# Patient Record
Sex: Female | Born: 1960 | Race: White | Hispanic: No | Marital: Married | Smoking: Never smoker
Health system: Southern US, Community
[De-identification: ages and names within clinical notes are randomized; demographics above are authoritative.]

## PROBLEM LIST (undated history)

## (undated) DIAGNOSIS — I10 Essential (primary) hypertension: Secondary | ICD-10-CM

## (undated) DIAGNOSIS — F419 Anxiety disorder, unspecified: Secondary | ICD-10-CM

## (undated) HISTORY — DX: Anxiety disorder, unspecified: F41.9

## (undated) HISTORY — DX: Essential (primary) hypertension: I10

## (undated) HISTORY — PX: TUBAL LIGATION: SHX77

---

## 2016-07-29 ENCOUNTER — Encounter (INDEPENDENT_AMBULATORY_CARE_PROVIDER_SITE_OTHER): Payer: Self-pay | Admitting: *Deleted

## 2019-04-06 ENCOUNTER — Other Ambulatory Visit: Payer: Self-pay

## 2019-04-06 DIAGNOSIS — Z20822 Contact with and (suspected) exposure to covid-19: Secondary | ICD-10-CM

## 2019-04-08 LAB — NOVEL CORONAVIRUS, NAA: SARS-CoV-2, NAA: NOT DETECTED

## 2019-04-10 ENCOUNTER — Telehealth: Payer: Self-pay | Admitting: General Practice

## 2019-04-10 NOTE — Telephone Encounter (Signed)
Negative COVID results given. Patient results "NOT Detected." Caller expressed understanding. ° °

## 2019-06-06 ENCOUNTER — Other Ambulatory Visit: Payer: Self-pay

## 2019-06-06 DIAGNOSIS — Z20822 Contact with and (suspected) exposure to covid-19: Secondary | ICD-10-CM

## 2019-06-08 LAB — NOVEL CORONAVIRUS, NAA

## 2019-06-09 ENCOUNTER — Telehealth: Payer: Self-pay

## 2019-06-09 NOTE — Telephone Encounter (Signed)
Pt. Calling for results. Specimen was insufficient. Instructed pt. To be re-tested. Verbalizes understanding.

## 2019-07-31 ENCOUNTER — Other Ambulatory Visit (HOSPITAL_COMMUNITY): Payer: Self-pay | Admitting: Internal Medicine

## 2019-07-31 DIAGNOSIS — Z1231 Encounter for screening mammogram for malignant neoplasm of breast: Secondary | ICD-10-CM

## 2019-08-07 ENCOUNTER — Inpatient Hospital Stay (HOSPITAL_COMMUNITY): Admission: RE | Admit: 2019-08-07 | Payer: Self-pay | Source: Ambulatory Visit

## 2019-08-17 ENCOUNTER — Ambulatory Visit (HOSPITAL_COMMUNITY): Payer: Self-pay

## 2019-08-24 ENCOUNTER — Ambulatory Visit (HOSPITAL_COMMUNITY): Payer: Self-pay

## 2019-10-02 ENCOUNTER — Ambulatory Visit (HOSPITAL_COMMUNITY): Payer: Self-pay

## 2019-10-12 ENCOUNTER — Inpatient Hospital Stay (HOSPITAL_COMMUNITY): Admission: RE | Admit: 2019-10-12 | Payer: Self-pay | Source: Ambulatory Visit

## 2019-10-19 ENCOUNTER — Ambulatory Visit (HOSPITAL_COMMUNITY): Payer: Self-pay

## 2019-10-26 ENCOUNTER — Ambulatory Visit (HOSPITAL_COMMUNITY)
Admission: RE | Admit: 2019-10-26 | Discharge: 2019-10-26 | Disposition: A | Discharge status: Home / Self Care | Payer: 59 | Source: Ambulatory Visit | Attending: Internal Medicine | Admitting: Internal Medicine

## 2019-10-26 ENCOUNTER — Encounter (HOSPITAL_COMMUNITY): Payer: Self-pay

## 2019-10-26 ENCOUNTER — Other Ambulatory Visit: Payer: Self-pay

## 2019-10-26 DIAGNOSIS — Z1231 Encounter for screening mammogram for malignant neoplasm of breast: Secondary | ICD-10-CM | POA: Insufficient documentation

## 2019-11-06 ENCOUNTER — Other Ambulatory Visit (HOSPITAL_COMMUNITY): Payer: Self-pay | Admitting: Internal Medicine

## 2019-11-06 ENCOUNTER — Inpatient Hospital Stay
Admission: RE | Admit: 2019-11-06 | Discharge: 2019-11-06 | Disposition: A | Discharge status: Home / Self Care | Payer: Self-pay | Source: Ambulatory Visit | Attending: Internal Medicine | Admitting: Internal Medicine

## 2019-11-06 DIAGNOSIS — Z1231 Encounter for screening mammogram for malignant neoplasm of breast: Secondary | ICD-10-CM

## 2019-11-08 ENCOUNTER — Telehealth: Payer: Self-pay | Admitting: Adult Health

## 2019-11-08 NOTE — Telephone Encounter (Signed)

## 2019-11-09 ENCOUNTER — Other Ambulatory Visit: Payer: 59 | Admitting: Adult Health

## 2019-12-18 ENCOUNTER — Other Ambulatory Visit: Payer: 59 | Admitting: Adult Health

## 2020-02-22 ENCOUNTER — Other Ambulatory Visit (HOSPITAL_COMMUNITY): Payer: Self-pay | Admitting: Internal Medicine

## 2020-02-22 ENCOUNTER — Ambulatory Visit (HOSPITAL_COMMUNITY)
Admission: RE | Admit: 2020-02-22 | Discharge: 2020-02-22 | Disposition: A | Discharge status: Home / Self Care | Payer: 59 | Source: Ambulatory Visit | Attending: Internal Medicine | Admitting: Internal Medicine

## 2020-02-22 ENCOUNTER — Other Ambulatory Visit: Payer: Self-pay

## 2020-02-22 DIAGNOSIS — M25562 Pain in left knee: Secondary | ICD-10-CM

## 2020-09-16 ENCOUNTER — Other Ambulatory Visit (HOSPITAL_COMMUNITY): Payer: Self-pay | Admitting: Internal Medicine

## 2020-09-16 DIAGNOSIS — Z1231 Encounter for screening mammogram for malignant neoplasm of breast: Secondary | ICD-10-CM

## 2020-10-28 ENCOUNTER — Ambulatory Visit (HOSPITAL_COMMUNITY)
Admission: RE | Admit: 2020-10-28 | Discharge: 2020-10-28 | Disposition: A | Discharge status: Home / Self Care | Payer: 59 | Source: Ambulatory Visit | Attending: Internal Medicine | Admitting: Internal Medicine

## 2020-10-28 ENCOUNTER — Other Ambulatory Visit: Payer: Self-pay

## 2020-10-28 DIAGNOSIS — Z1231 Encounter for screening mammogram for malignant neoplasm of breast: Secondary | ICD-10-CM | POA: Insufficient documentation

## 2021-09-22 IMAGING — MG MM DIGITAL SCREENING BILAT W/ TOMO AND CAD
8 series · 9 of 24 positions shown · non-contrast
Comparison: Previous exam(s).

CLINICAL DATA: Screening.

EXAM:
DIGITAL SCREENING BILATERAL MAMMOGRAM WITH TOMOSYNTHESIS AND CAD
TECHNIQUE: Bilateral screening digital craniocaudal and mediolateral oblique
mammograms were obtained. Bilateral screening digital breast
tomosynthesis was performed. The images were evaluated with
computer-aided detection.

[L MLO synth-2D]
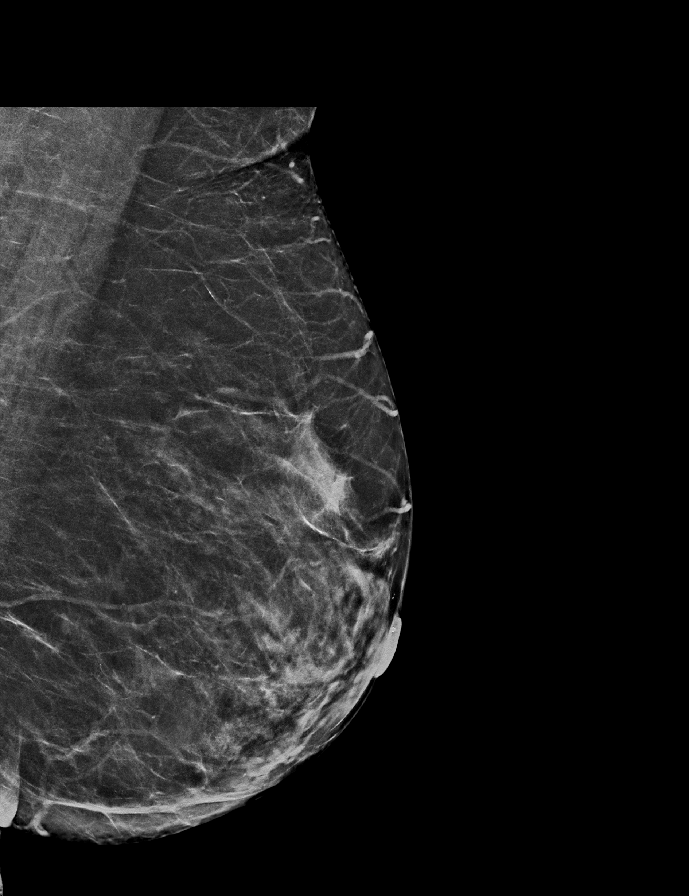

[R CC synth-2D]
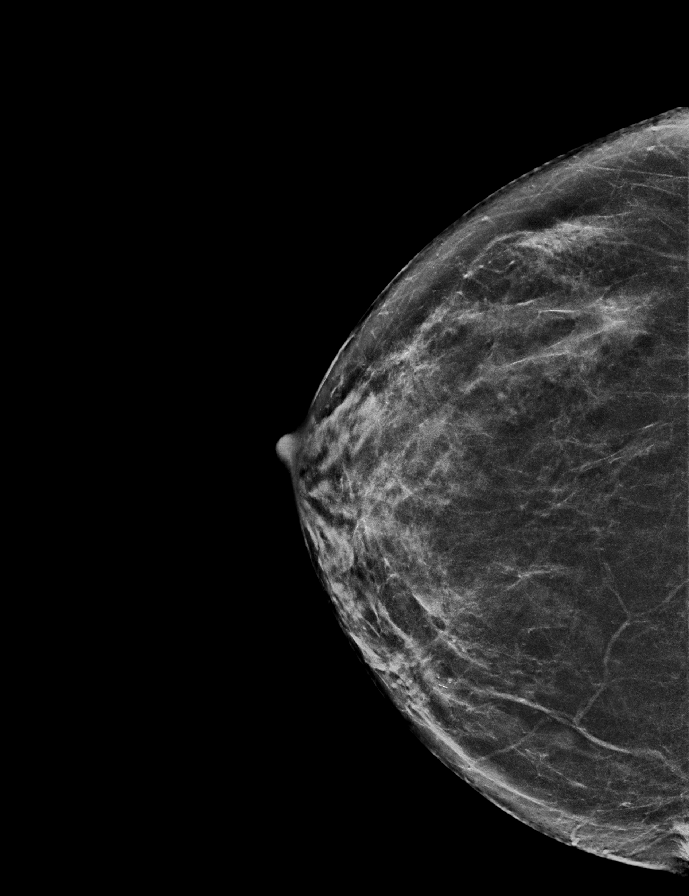

[R MLO synth-2D]
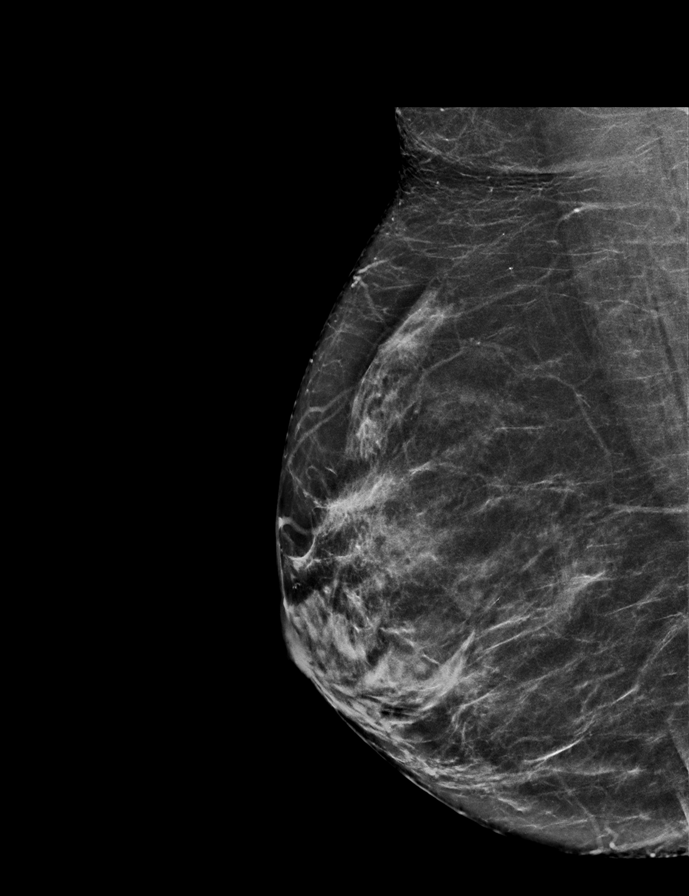

[L CC synth-2D]
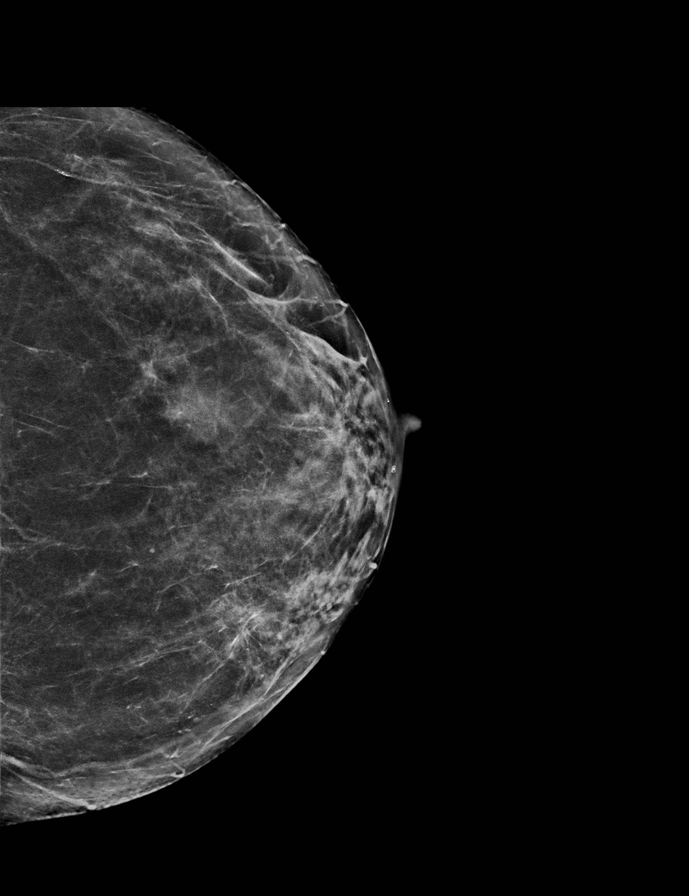

[L CC tomo · 2 of 60 frames shown]
[frame 20/60]
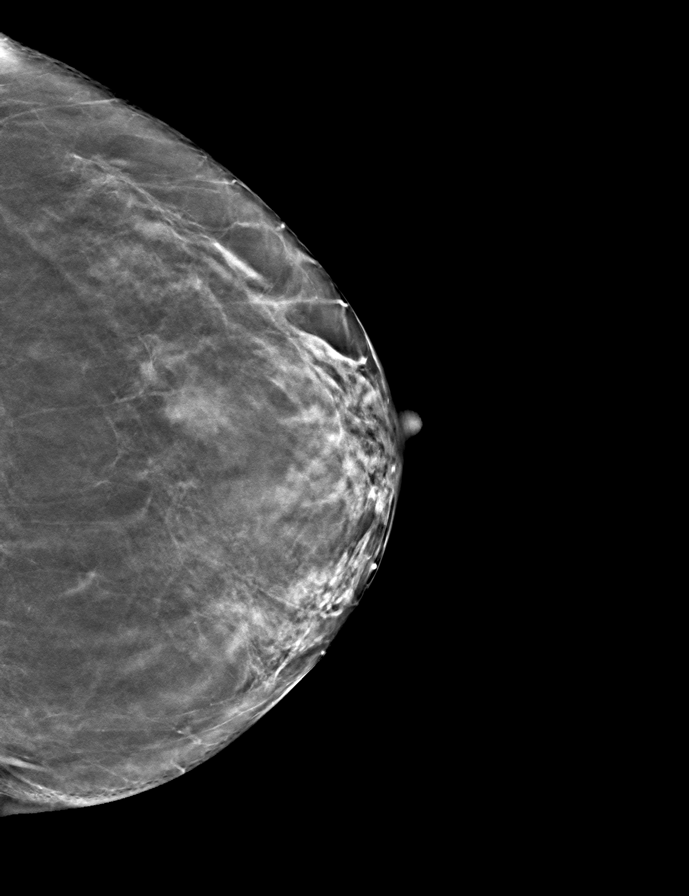
[frame 31/60]
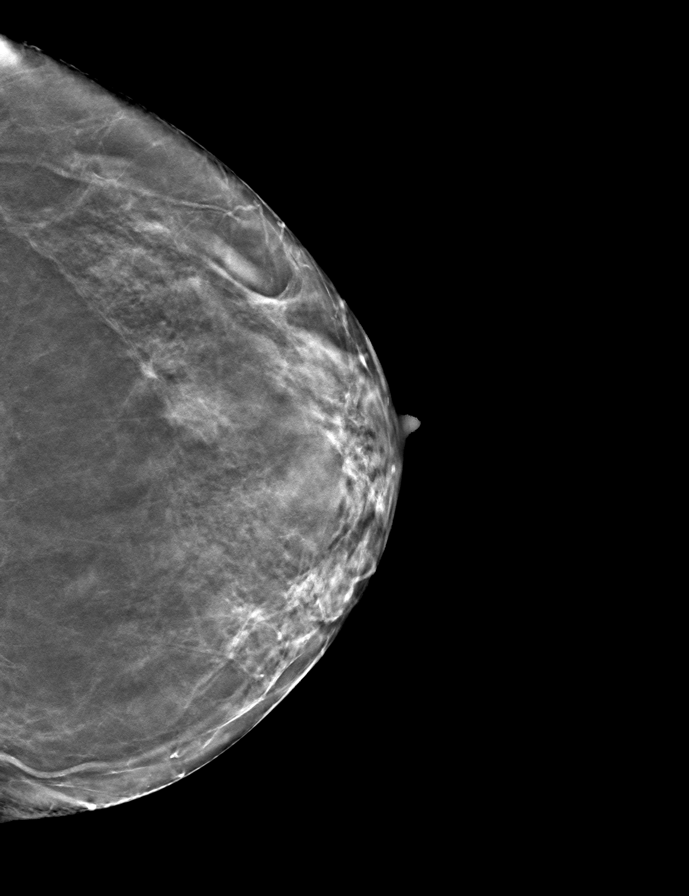

[R MLO tomo · tomo slice 32/63.0]
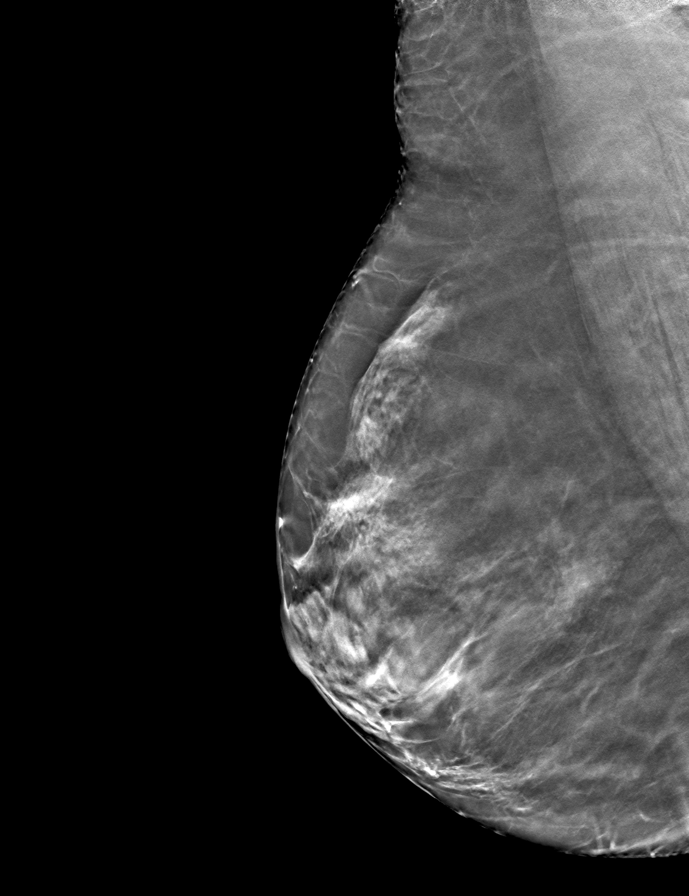

[L MLO tomo · tomo slice 33/64.0]
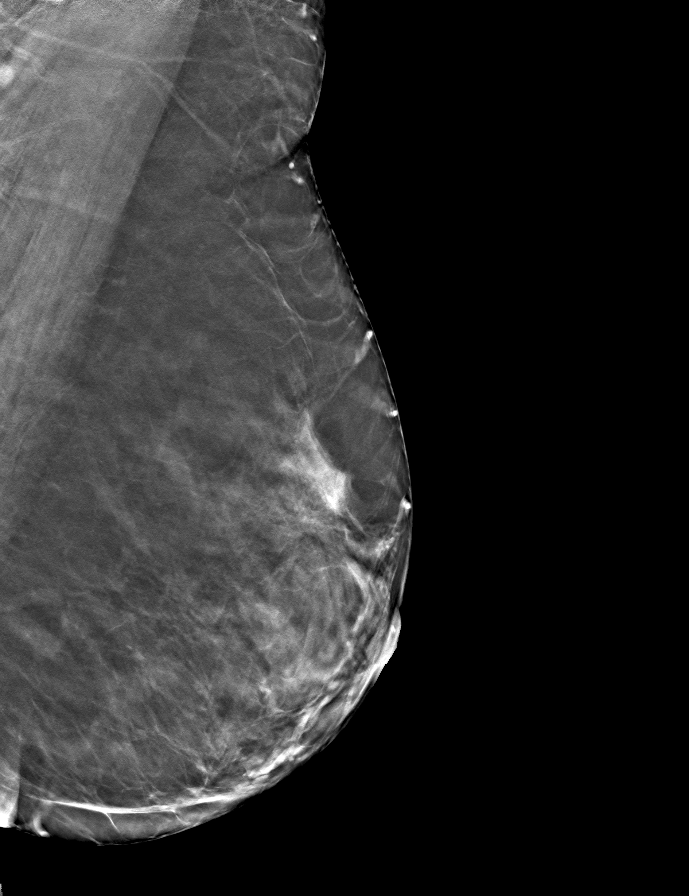

[R CC tomo · tomo slice 30/59.0]
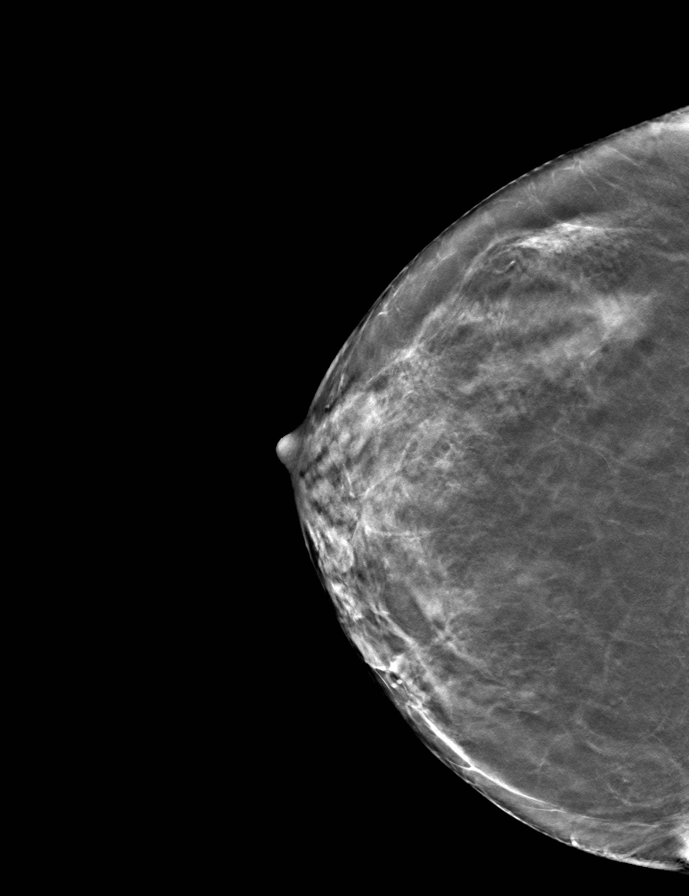

[9 of 24 positions shown; findings below may reference images not displayed]

ACR Breast Density Category c: The breast tissue is heterogeneously
dense, which may obscure small masses.
FINDINGS: There are no findings suspicious for malignancy. The images were
evaluated with computer-aided detection.
IMPRESSION: No mammographic evidence of malignancy. A result letter of this
screening mammogram will be mailed directly to the patient.

RECOMMENDATION:
Screening mammogram in one year. (Code:T4-5-GWO)

BI-RADS CATEGORY  1: Negative.

## 2021-10-14 ENCOUNTER — Other Ambulatory Visit (HOSPITAL_COMMUNITY): Payer: Self-pay | Admitting: Internal Medicine

## 2021-10-14 DIAGNOSIS — Z1231 Encounter for screening mammogram for malignant neoplasm of breast: Secondary | ICD-10-CM

## 2021-11-03 ENCOUNTER — Ambulatory Visit (HOSPITAL_COMMUNITY): Payer: 59

## 2021-11-14 ENCOUNTER — Ambulatory Visit (HOSPITAL_COMMUNITY): Payer: 59

## 2021-12-05 ENCOUNTER — Ambulatory Visit (HOSPITAL_COMMUNITY): Payer: 59

## 2022-01-05 ENCOUNTER — Ambulatory Visit (HOSPITAL_COMMUNITY)
Admission: RE | Admit: 2022-01-05 | Discharge: 2022-01-05 | Disposition: A | Discharge status: Home / Self Care | Payer: 59 | Source: Ambulatory Visit | Attending: Internal Medicine | Admitting: Internal Medicine

## 2022-01-05 DIAGNOSIS — Z1231 Encounter for screening mammogram for malignant neoplasm of breast: Secondary | ICD-10-CM | POA: Diagnosis present

## 2023-02-16 ENCOUNTER — Other Ambulatory Visit (HOSPITAL_COMMUNITY): Payer: Self-pay | Admitting: Internal Medicine

## 2023-02-16 DIAGNOSIS — Z1231 Encounter for screening mammogram for malignant neoplasm of breast: Secondary | ICD-10-CM

## 2023-02-25 ENCOUNTER — Other Ambulatory Visit (HOSPITAL_COMMUNITY)
Admission: RE | Admit: 2023-02-25 | Discharge: 2023-02-25 | Disposition: A | Discharge status: Home / Self Care | Payer: Self-pay | Source: Ambulatory Visit | Attending: Adult Health | Admitting: Adult Health

## 2023-02-25 ENCOUNTER — Ambulatory Visit (INDEPENDENT_AMBULATORY_CARE_PROVIDER_SITE_OTHER): Payer: Self-pay | Admitting: Adult Health

## 2023-02-25 ENCOUNTER — Encounter: Payer: Self-pay | Admitting: Adult Health

## 2023-02-25 VITALS — BP 156/88 | HR 69 | Ht 67.0 in | Wt 151.0 lb

## 2023-02-25 DIAGNOSIS — R1031 Right lower quadrant pain: Secondary | ICD-10-CM

## 2023-02-25 DIAGNOSIS — Z124 Encounter for screening for malignant neoplasm of cervix: Secondary | ICD-10-CM | POA: Insufficient documentation

## 2023-02-25 DIAGNOSIS — N952 Postmenopausal atrophic vaginitis: Secondary | ICD-10-CM | POA: Insufficient documentation

## 2023-02-25 DIAGNOSIS — I10 Essential (primary) hypertension: Secondary | ICD-10-CM
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
## 2023-03-03 ENCOUNTER — Ambulatory Visit (HOSPITAL_COMMUNITY): Payer: 59

## 2023-03-04 LAB — CYTOLOGY - PAP
Comment: NEGATIVE
Diagnosis: NEGATIVE
High risk HPV: NEGATIVE

## 2023-03-10 ENCOUNTER — Ambulatory Visit (HOSPITAL_COMMUNITY): Payer: Self-pay

## 2023-03-17 ENCOUNTER — Inpatient Hospital Stay (HOSPITAL_COMMUNITY): Admission: RE | Admit: 2023-03-17 | Payer: Self-pay | Source: Ambulatory Visit

## 2023-04-12 ENCOUNTER — Ambulatory Visit: Payer: Self-pay | Admitting: Adult Health

## 2023-07-07 ENCOUNTER — Ambulatory Visit (HOSPITAL_COMMUNITY): Payer: Self-pay
